# Patient Record
Sex: Female | Born: 1935 | Race: White | Hispanic: No | Smoking: Never smoker
Health system: Southern US, Community
[De-identification: ages and names within clinical notes are randomized; demographics above are authoritative.]

## PROBLEM LIST (undated history)

## (undated) DIAGNOSIS — I10 Essential (primary) hypertension: Secondary | ICD-10-CM

## (undated) DIAGNOSIS — M329 Systemic lupus erythematosus, unspecified: Secondary | ICD-10-CM

## (undated) DIAGNOSIS — M069 Rheumatoid arthritis, unspecified: Secondary | ICD-10-CM

## (undated) HISTORY — DX: Rheumatoid arthritis, unspecified: M06.9

## (undated) HISTORY — DX: Systemic lupus erythematosus, unspecified: M32.9

## (undated) HISTORY — DX: Essential (primary) hypertension: I10

---

## 1999-04-01 ENCOUNTER — Encounter: Admission: RE | Admit: 1999-04-01 | Discharge: 1999-04-01 | Payer: Self-pay

## 2020-01-30 ENCOUNTER — Emergency Department (HOSPITAL_BASED_OUTPATIENT_CLINIC_OR_DEPARTMENT_OTHER): Payer: Medicare Other

## 2020-01-30 ENCOUNTER — Encounter (HOSPITAL_BASED_OUTPATIENT_CLINIC_OR_DEPARTMENT_OTHER): Payer: Self-pay

## 2020-01-30 ENCOUNTER — Other Ambulatory Visit: Payer: Self-pay

## 2020-01-30 ENCOUNTER — Emergency Department (HOSPITAL_BASED_OUTPATIENT_CLINIC_OR_DEPARTMENT_OTHER)
Admission: EM | Admit: 2020-01-30 | Discharge: 2020-01-30 | Disposition: A | Discharge status: Home / Self Care | Payer: Medicare Other | Attending: Emergency Medicine | Admitting: Emergency Medicine

## 2020-01-30 DIAGNOSIS — S0101XA Laceration without foreign body of scalp, initial encounter: Secondary | ICD-10-CM

## 2020-01-30 DIAGNOSIS — I10 Essential (primary) hypertension: Secondary | ICD-10-CM | POA: Diagnosis not present

## 2020-01-30 DIAGNOSIS — W01198A Fall on same level from slipping, tripping and stumbling with subsequent striking against other object, initial encounter: Secondary | ICD-10-CM | POA: Insufficient documentation

## 2020-01-30 DIAGNOSIS — Z23 Encounter for immunization: Secondary | ICD-10-CM | POA: Insufficient documentation

## 2020-01-30 DIAGNOSIS — Z79899 Other long term (current) drug therapy: Secondary | ICD-10-CM | POA: Insufficient documentation

## 2020-01-30 DIAGNOSIS — W19XXXA Unspecified fall, initial encounter: Secondary | ICD-10-CM

## 2020-01-30 MED ORDER — TETANUS-DIPHTH-ACELL PERTUSSIS 5-2.5-18.5 LF-MCG/0.5 IM SUSP: 0.5000 mL | Freq: Once | INTRAMUSCULAR | Status: DC

## 2020-01-30 MED ORDER — LIDOCAINE-EPINEPHRINE-TETRACAINE (LET) TOPICAL GEL
3.0000 mL | Freq: Once | TOPICAL | Status: AC
  Administered 2020-01-30: 3 mL via TOPICAL
  Filled 2020-01-30: qty 3

## 2020-01-30 MED ORDER — TETANUS-DIPHTH-ACELL PERTUSSIS 5-2.5-18.5 LF-MCG/0.5 IM SUSP
0.5000 mL | Freq: Once | INTRAMUSCULAR | Status: AC
  Administered 2020-01-30: 0.5 mL via INTRAMUSCULAR
  Filled 2020-01-30: qty 0.5

## 2020-01-30 NOTE — ED Notes (Signed)
Patient transported to CT 

## 2020-01-30 NOTE — ED Notes (Signed)
ED Provider at bedside. 

## 2020-01-30 NOTE — Discharge Instructions (Signed)
Keep the wound clean and dry.  Follow-up with your doctor to have the staples removed in about 1 week.  Return here as needed if you have any worsening symptoms.

## 2020-01-30 NOTE — ED Triage Notes (Signed)
Pt states she tripped/fell struck head on the corner of a wall-sent from [facility]-to triage in [facility]

## 2020-01-30 NOTE — ED Provider Notes (Signed)
[facility] EMERGENCY DEPARTMENT Provider Note   CSN: [id number] Arrival date & time: 01/30/20  1740     History Chief Complaint  Patient presents with  . Fall    Michele Cooke is a 86 y.o. female.  Patient is a 86 year old female with a history of hypertension, hyperlipidemia, lupus and rheumatoid arthritis who presents after a fall.  She says she was bending over to pick something up and lost her balance, striking her head on the wall.  There is no loss of consciousness.  She denies any significant headache.  She has a wound to her left scalp area.  She denies any neck or back pain.  She denies any other injuries from fall.  She is on Plavix and aspirin.  She was initially seen in urgent care and sent here for imaging given that she was on Plavix and aspirin.        Past Medical History:  Diagnosis Date  . Hypertension   . Lupus (HCC)   . Rheumatoid arthritis (HCC)     There are no problems to display for this patient.   Past Surgical History:  Procedure Laterality Date  . ABDOMINAL HYSTERECTOMY    . CHOLECYSTECTOMY       OB History   No obstetric history on file.     No family history on file.  Social History   Tobacco Use  . Smoking status: Never Smoker  . Smokeless tobacco: Never Used  Vaping Use  . Vaping Use: Never used  Substance Use Topics  . Alcohol use: Never  . Drug use: Never    Home Medications Prior to Admission medications   Medication Sig Start Date End Date Taking? Authorizing Provider  fluticasone (FLONASE) 50 MCG/ACT nasal spray 2 sprays by Each Nare route nightly. 06/21/18  Yes [provider]  furosemide (LASIX) 20 MG tablet Take by mouth. 10/04/12  Yes [provider]  sulfaSALAzine (AZULFIDINE) 500 MG tablet Take 1 tablet by mouth daily. 10/26/12  Yes [provider]  aspirin 81 MG EC tablet Take by mouth.    [provider]  atorvastatin (LIPITOR) 80 MG tablet Take 80 mg by mouth  daily. 01/29/20   [provider]  carvedilol (COREG) 25 MG tablet Take 25 mg by mouth 2 (two) times daily. 11/16/19   [provider]  Cholecalciferol 25 MCG (1000 UT) capsule 1,000 Units.    [provider]  colchicine 0.6 MG tablet Take 0.6 mg by mouth 2 (two) times daily. 11/26/19   [provider]  cyanocobalamin 1000 MCG tablet Take by mouth.    [provider]  ferrous sulfate 325 (65 FE) MG tablet Take by mouth.    [provider]  pantoprazole (PROTONIX) 20 MG tablet  01/26/20   [provider]  traMADol (ULTRAM) 50 MG tablet Take 50 mg by mouth every 8 (eight) hours as needed. 11/23/19   [provider]  valsartan (DIOVAN) 160 MG tablet Take by mouth. 01/08/20   [provider]    Allergies    Dilaudid [hydromorphone] and Sudafed [pseudoephedrine]  Review of Systems   Review of Systems  Constitutional: Negative for chills, diaphoresis, fatigue and fever.  HENT: Negative for congestion, rhinorrhea and sneezing.   Eyes: Negative.   Respiratory: Negative for cough, chest tightness and shortness of breath.   Cardiovascular: Negative for chest pain and leg swelling.  Gastrointestinal: Negative for abdominal pain, blood in stool, diarrhea, nausea and vomiting.  Genitourinary: Negative  for difficulty urinating, flank pain, frequency and hematuria.  Musculoskeletal: Negative for arthralgias and back pain.  Skin: Positive for wound. Negative for rash.  Neurological: Negative for dizziness, speech difficulty, weakness, numbness and headaches.    Physical Exam Updated Vital Signs BP 140/84 (BP Location: Right Arm)   Pulse 68   Temp 98.8 F (37.1 C) (Oral)   Resp 20   SpO2 99%   Physical Exam Constitutional:      Appearance: She is well-developed.  HENT:     Head: Normocephalic.     Comments: 1.5 cm laceration to the left parietal scalp area.  There are some minor oozing of blood from the wound but no  active heavy bleeding.  There is a small surrounding hematoma. Eyes:     Pupils: Pupils are equal, round, and reactive to light.  Cardiovascular:     Rate and Rhythm: Normal rate and regular rhythm.     Heart sounds: Normal heart sounds.  Pulmonary:     Effort: Pulmonary effort is normal. No respiratory distress.     Breath sounds: Normal breath sounds. No wheezing or rales.  Chest:     Chest wall: No tenderness.  Abdominal:     General: Bowel sounds are normal.     Palpations: Abdomen is soft.     Tenderness: There is no abdominal tenderness. There is no guarding or rebound.  Musculoskeletal:        General: Normal range of motion.     Cervical back: Normal range of motion and neck supple.  Lymphadenopathy:     Cervical: No cervical adenopathy.  Skin:    General: Skin is warm and dry.     Findings: No rash.  Neurological:     General: No focal deficit present.     Mental Status: She is alert and oriented to person, place, and time.     ED Results / Procedures / Treatments   Labs (all labs ordered are listed, but only abnormal results are displayed) Labs Reviewed - No data to display  EKG None  Radiology CT Head Wo Contrast  Result Date: 01/30/2020 CLINICAL DATA:  Initial encounter. 86 y/o female s/p fall at home today (APPROX.3:45pm), left side of head head hit corner of furniture/wall. EXAM: CT HEAD WITHOUT CONTRAST CT CERVICAL SPINE WITHOUT CONTRAST TECHNIQUE: Multidetector CT imaging of the head and cervical spine was performed following the standard protocol without intravenous contrast. Multiplanar CT image reconstructions of the cervical spine were also generated. COMPARISON:  None. FINDINGS: CT HEAD FINDINGS Brain: Cerebral ventricle sizes are concordant with the degree of cerebral volume loss. No evidence of large-territorial acute infarction. No parenchymal hemorrhage. No mass lesion. No extra-axial collection. No mass effect or midline shift. No hydrocephalus.  Basilar cisterns are patent. Vascular: No hyperdense vessel or unexpected calcification. Skull: Left frontal scalp laceration. No retained radiopaque foreign body. No subgaleal hematoma. Negative for fracture or focal lesion. Sinuses/Orbits: Paranasal sinuses and mastoid air cells are clear. Bilateral lens replacement. Otherwise the orbits are unremarkable. Other: Vertebral and cavernous internal carotid artery calcifications. CT CERVICAL SPINE FINDINGS Alignment: Grade 1 anterolisthesis of C3 on C4. Grade 1 anterolisthesis of C4 on C5. Grade 1 anterolisthesis of C5 on C6. Skull base and vertebrae: Diffusely decreased bone density. Multilevel moderate severe degenerative changes of the spine with facet arthropathy and uncovertebral arthropathy. Cortical discontinuity of the superior endplate of the C6 vertebral body only seen on sagittal view likely degenerative in etiology. No definite acute displaced fracture. No  primary bone lesion or focal pathologic process. Soft tissues and spinal canal: No prevertebral fluid or swelling. No visible canal hematoma. Disc levels:  Maintained. Upper chest: Negative. Other: Several calcifications within bilateral thyroid glands. IMPRESSION: 1. No acute intracranial abnormality. 2. Left frontal scalp laceration with no underlying calvarial fracture. 3. No acute displaced fracture or traumatic listhesis of the cervical spine. Electronically Signed   By: Tish Frederickson M.D.   On: 01/30/2020 19:24   CT Cervical Spine Wo Contrast  Result Date: 01/30/2020 CLINICAL DATA:  Initial encounter. 86 y/o female s/p fall at home today (APPROX.3:45pm), left side of head head hit corner of furniture/wall. EXAM: CT HEAD WITHOUT CONTRAST CT CERVICAL SPINE WITHOUT CONTRAST TECHNIQUE: Multidetector CT imaging of the head and cervical spine was performed following the standard protocol without intravenous contrast. Multiplanar CT image reconstructions of the cervical spine were also generated.  COMPARISON:  None. FINDINGS: CT HEAD FINDINGS Brain: Cerebral ventricle sizes are concordant with the degree of cerebral volume loss. No evidence of large-territorial acute infarction. No parenchymal hemorrhage. No mass lesion. No extra-axial collection. No mass effect or midline shift. No hydrocephalus. Basilar cisterns are patent. Vascular: No hyperdense vessel or unexpected calcification. Skull: Left frontal scalp laceration. No retained radiopaque foreign body. No subgaleal hematoma. Negative for fracture or focal lesion. Sinuses/Orbits: Paranasal sinuses and mastoid air cells are clear. Bilateral lens replacement. Otherwise the orbits are unremarkable. Other: Vertebral and cavernous internal carotid artery calcifications. CT CERVICAL SPINE FINDINGS Alignment: Grade 1 anterolisthesis of C3 on C4. Grade 1 anterolisthesis of C4 on C5. Grade 1 anterolisthesis of C5 on C6. Skull base and vertebrae: Diffusely decreased bone density. Multilevel moderate severe degenerative changes of the spine with facet arthropathy and uncovertebral arthropathy. Cortical discontinuity of the superior endplate of the C6 vertebral body only seen on sagittal view likely degenerative in etiology. No definite acute displaced fracture. No primary bone lesion or focal pathologic process. Soft tissues and spinal canal: No prevertebral fluid or swelling. No visible canal hematoma. Disc levels:  Maintained. Upper chest: Negative. Other: Several calcifications within bilateral thyroid glands. IMPRESSION: 1. No acute intracranial abnormality. 2. Left frontal scalp laceration with no underlying calvarial fracture. 3. No acute displaced fracture or traumatic listhesis of the cervical spine. Electronically Signed   By: Tish Frederickson M.D.   On: 01/30/2020 19:24    Procedures Procedures (including critical care time)  Medications Ordered in ED Medications  lidocaine-EPINEPHrine-tetracaine (LET) topical gel (3 mLs Topical Given 01/30/20 1845)   Tdap (BOOSTRIX) injection 0.5 mL (0.5 mLs Intramuscular Given 01/30/20 1949)    ED Course  I have reviewed the triage vital signs and the nursing notes.  Pertinent labs & imaging results that were available during my care of the patient were reviewed by me and considered in my medical decision making (see chart for details).    MDM Rules/Calculators/A&P                          Patient is a 86 year old who presents with mechanical fall.  She had a CT scan of her head and cervical spine which showed no acute abnormalities.  She has a small laceration which was repaired by [place] Robinson, PA-C.  She is otherwise well-appearing.  No other identified injuries.  She was discharged home in good condition.  Wound care instructions and head injury precautions were given.  Her tetanus shot was updated.  She will follow-up in 1 week for her staple  removal.  Return precautions were given. Final Clinical Impression(s) / ED Diagnoses Final diagnoses:  Fall, initial encounter  Laceration of scalp, initial encounter    Rx / DC Orders ED Discharge Orders    None       Rolan BuccoBelfi, Reaghan Kawa, MD 01/30/20 2041

## 2020-01-30 NOTE — ED Notes (Signed)
Irrigated Left side of head laceration, tolerated will. Bleeding stopped. LET applied.

## 2020-01-30 NOTE — ED Provider Notes (Signed)
LACERATION REPAIR Performed by: [place] N Calaya Gildner Authorized by: [place] N Shilynn Hoch Consent: Verbal consent obtained. Risks and benefits: risks, benefits and alternatives were discussed Consent given by: patient Patient identity confirmed: provided demographic data Prepped and Draped in normal sterile fashion Wound explored  Laceration Location: left temporal scalp  Laceration Length: 4 cm  No Foreign Bodies seen or palpated  Anesthesia: topical LET, anesthesia achieved.  Irrigation method: performed by nursing staff - sterile water Amount of cleaning: standard  Skin closure: staples  Number of staples: 3   Patient tolerance: Patient tolerated the procedure well with no immediate complications. Wound with close proximation     Rimsha Trembley, [place] N, PA-C 01/30/20 2005    Rolan Bucco, MD 01/30/20 2236

## 2021-03-06 IMAGING — CT CT HEAD W/O CM
4 of 5 series · 14 of 47 positions shown, 16 images · non-contrast
Comparison: None.

CLINICAL DATA: Initial encounter. 89 y/o female s/p fall at home
today (APPROX.[DATE]), left side of head head hit corner of
furniture/wall.

EXAM:
CT HEAD WITHOUT CONTRAST
CT CERVICAL SPINE WITHOUT CONTRAST
TECHNIQUE: Multidetector CT imaging of the head and cervical spine was
performed following the standard protocol without intravenous
contrast. Multiplanar CT image reconstructions of the cervical spine
were also generated.

[Series 506: head 5.0 h30s · axial · 0.41mm/px · z∈[+1051,+1166]mm · 7 of 31 slices shown, 9 images]
[im 4/31  brain]
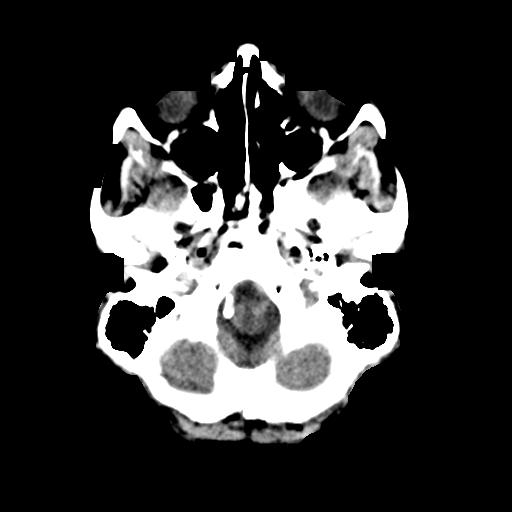
[im 4/31  bone]
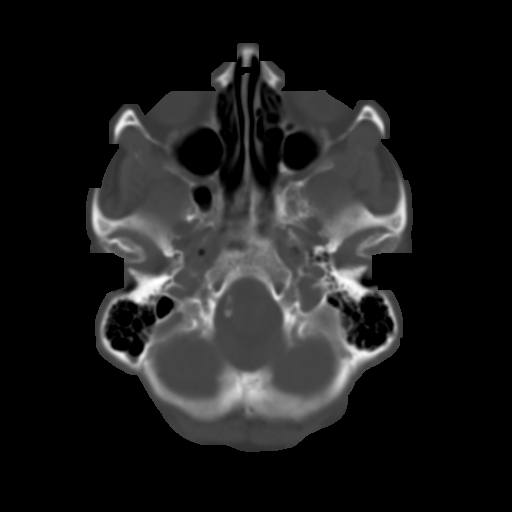
[im 8/31  brain]
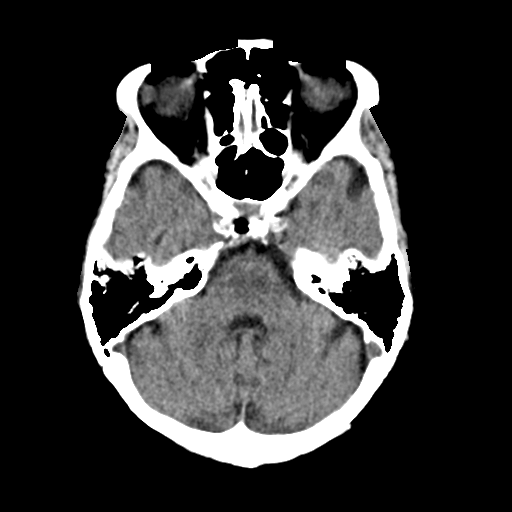
[im 12/31  brain]
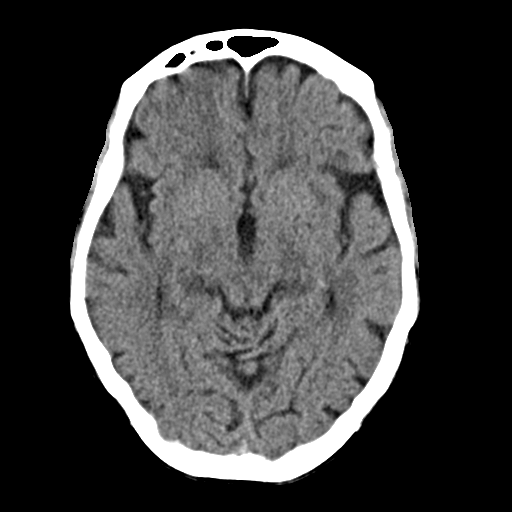
[im 16/31  brain]
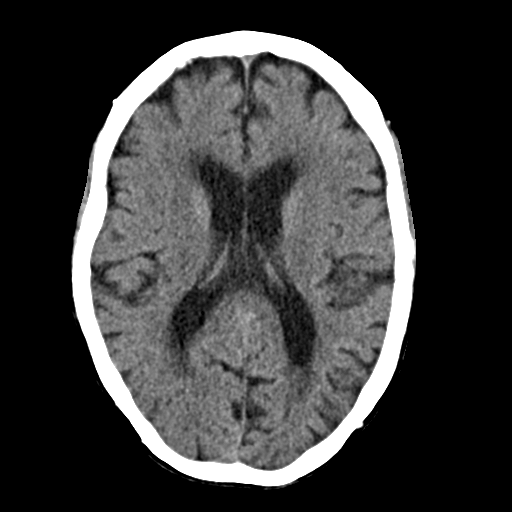
[im 19/31  brain]
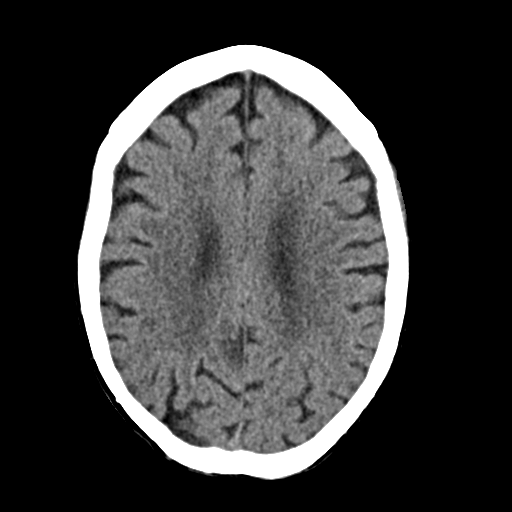
[im 19/31  bone]
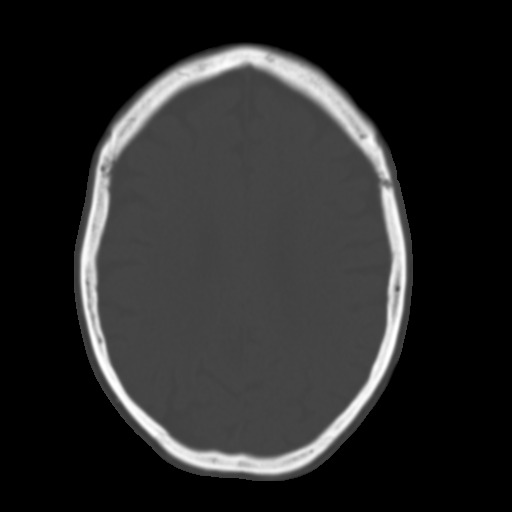
[im 23/31  brain]
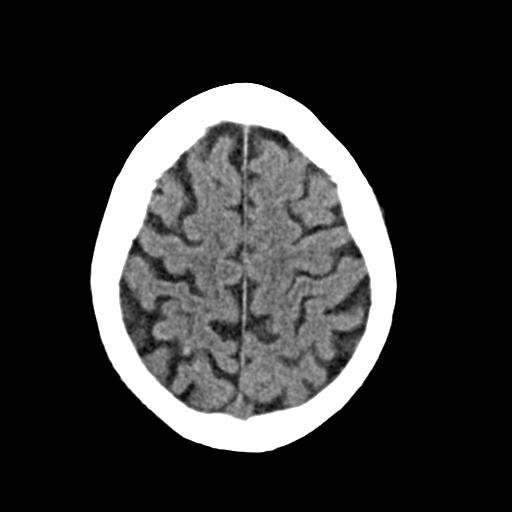
[im 27/31  brain]
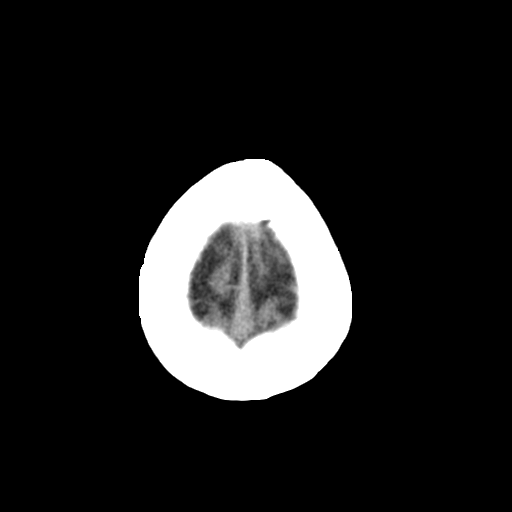

[Series 508: head 3.0 mpr cor · coronal · 0.29mm/px · 3 of 69 slices shown]
[im 23/69  brain]
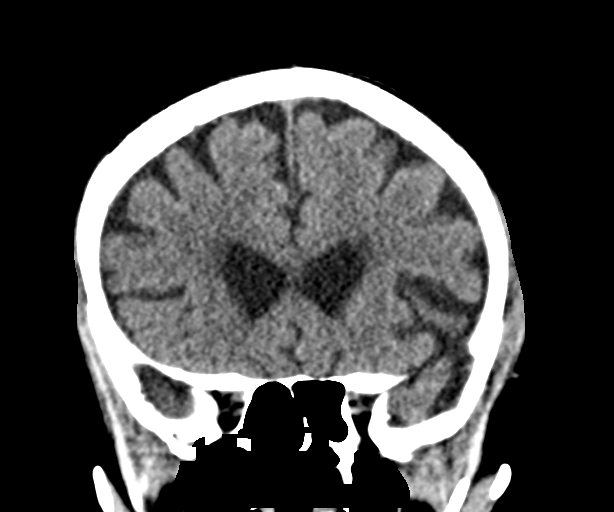
[im 31/69  brain]
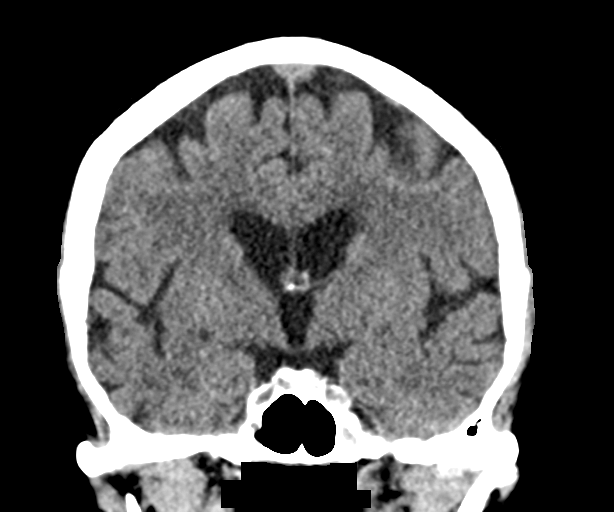
[im 38/69  brain]
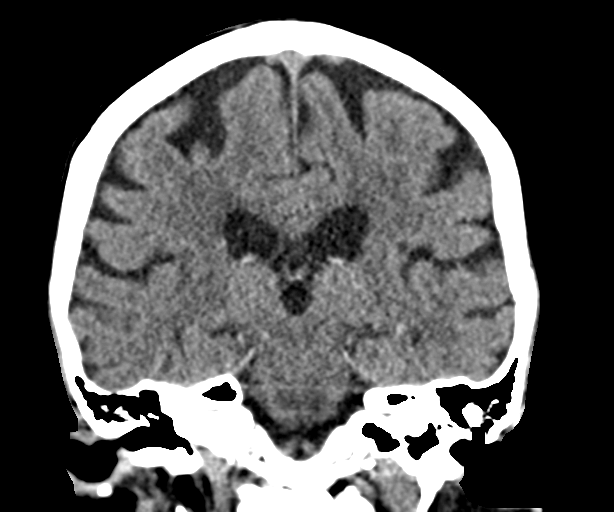

[Series 512: c_spine 2.0 i30s 3 · axial · 0.39mm/px · z∈[+932,+946]mm · 2 of 73 slices shown]
[im 8/73  brain]
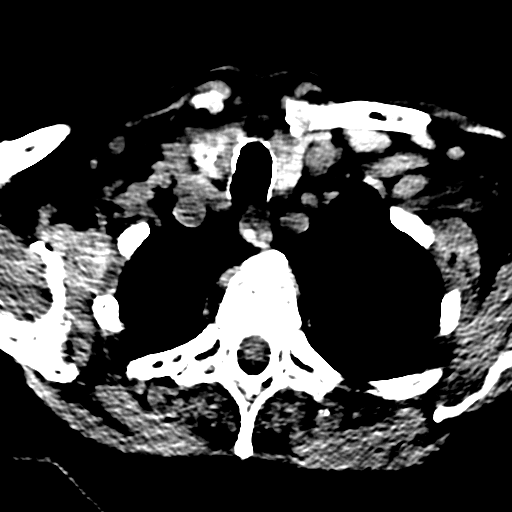
[im 15/73  brain]
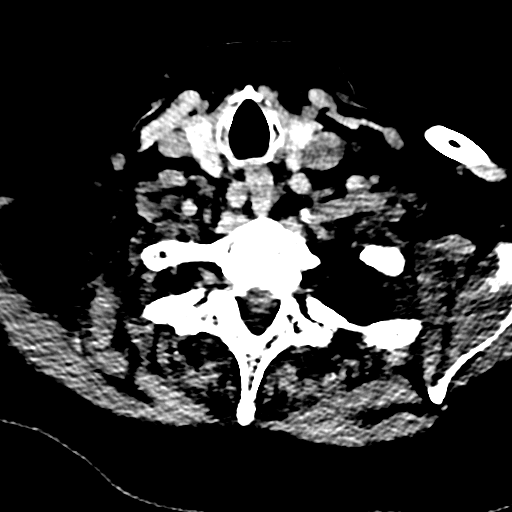

[Series 515: sagittals · sagittal · 0.23mm/px · 2 of 69 slices shown]
[im 23/69  brain]
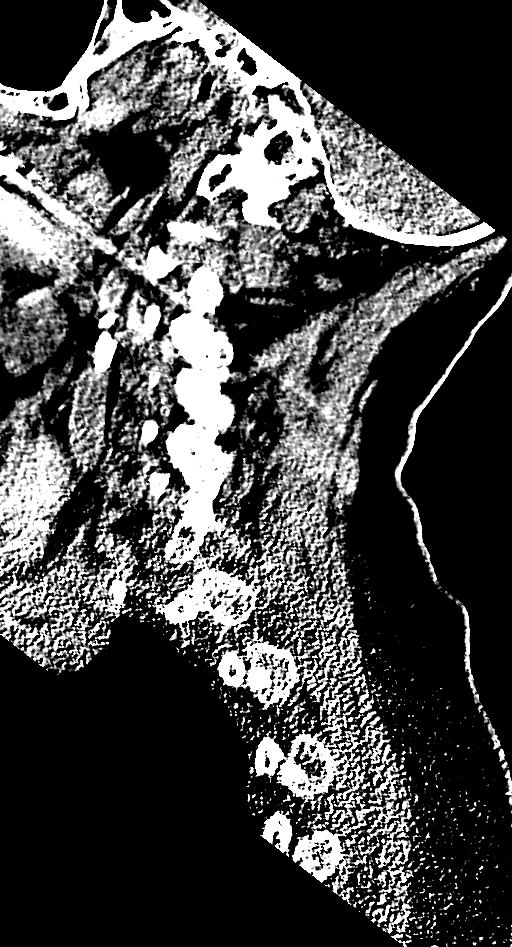
[im 46/69  brain]
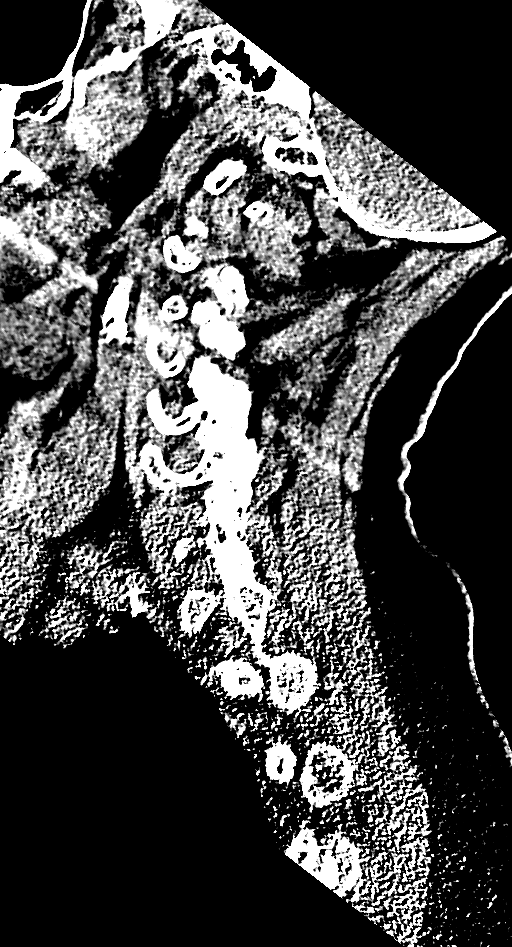

[14 of 47 positions shown; findings below may reference images not displayed]

FINDINGS: CT HEAD FINDINGS

Brain:

Cerebral ventricle sizes are concordant with the degree of cerebral
volume loss.

No evidence of large-territorial acute infarction. No parenchymal
hemorrhage. No mass lesion. No extra-axial collection.

No mass effect or midline shift. No hydrocephalus. Basilar cisterns
are patent.

Vascular: No hyperdense vessel or unexpected calcification.

Skull: Left frontal scalp laceration. No retained radiopaque foreign
body. No subgaleal hematoma. Negative for fracture or focal lesion.

Sinuses/Orbits: Paranasal sinuses and mastoid air cells are clear.
Bilateral lens replacement. Otherwise the orbits are unremarkable.

Other: Vertebral and cavernous internal carotid artery
calcifications.

CT CERVICAL SPINE FINDINGS

Alignment: Grade 1 anterolisthesis of C3 on C4. Grade 1
anterolisthesis of C4 on C5. Grade 1 anterolisthesis of C5 on C6.

Skull base and vertebrae: Diffusely decreased bone density.
Multilevel moderate severe degenerative changes of the spine with
facet arthropathy and uncovertebral arthropathy. Cortical
discontinuity of the superior endplate of the C6 vertebral body only
seen on sagittal view likely degenerative in etiology. No definite
acute displaced fracture. No primary bone lesion or focal pathologic
process.

Soft tissues and spinal canal: No prevertebral fluid or swelling. No
visible canal hematoma.

Disc levels:  Maintained.

Upper chest: Negative.

Other: Several calcifications within bilateral thyroid glands.
IMPRESSION: 1. No acute intracranial abnormality.
2. Left frontal scalp laceration with no underlying calvarial
fracture.
3. No acute displaced fracture or traumatic listhesis of the
cervical spine.
